# Patient Record
Sex: Female | Born: 1985 | Race: Asian | Hispanic: No | Marital: Married | Smoking: Never smoker
Health system: Southern US, Community
[De-identification: ages and names within clinical notes are randomized; demographics above are authoritative.]

## PROBLEM LIST (undated history)

## (undated) DIAGNOSIS — R5600 Simple febrile convulsions: Secondary | ICD-10-CM

## (undated) DIAGNOSIS — N200 Calculus of kidney: Secondary | ICD-10-CM

## (undated) DIAGNOSIS — H409 Unspecified glaucoma: Secondary | ICD-10-CM

## (undated) HISTORY — PX: EYE SURGERY: SHX253

## (undated) HISTORY — PX: BREAST SURGERY: SHX581

---
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
## 2007-09-09 ENCOUNTER — Ambulatory Visit (HOSPITAL_COMMUNITY): Admission: RE | Admit: 2007-09-09 | Discharge: 2007-09-09 | Payer: Self-pay | Admitting: Obstetrics & Gynecology

## 2007-12-16 ENCOUNTER — Inpatient Hospital Stay (HOSPITAL_COMMUNITY): Admission: AD | Admit: 2007-12-16 | Discharge: 2007-12-16 | Payer: Self-pay | Admitting: Obstetrics

## 2007-12-23 ENCOUNTER — Inpatient Hospital Stay (HOSPITAL_COMMUNITY): Admission: AD | Admit: 2007-12-23 | Discharge: 2007-12-25 | Payer: Self-pay | Admitting: Obstetrics

## 2008-09-11 IMAGING — US US OB COMP +14 WK
1 series · 14 of 28 positions shown · non-contrast
Comparison: none

OBSTETRICAL ULTRASOUND:

 This ultrasound exam was performed in the [HOSPITAL] Ultrasound Department.  The OB US report was generated in the AS system, and faxed to the ordering physician.  This report is also available in [REDACTED] PACS.

[Series 1: us ob comp +14 wk · 83 acquisitions, 14 frames shown]
[im 4/83]
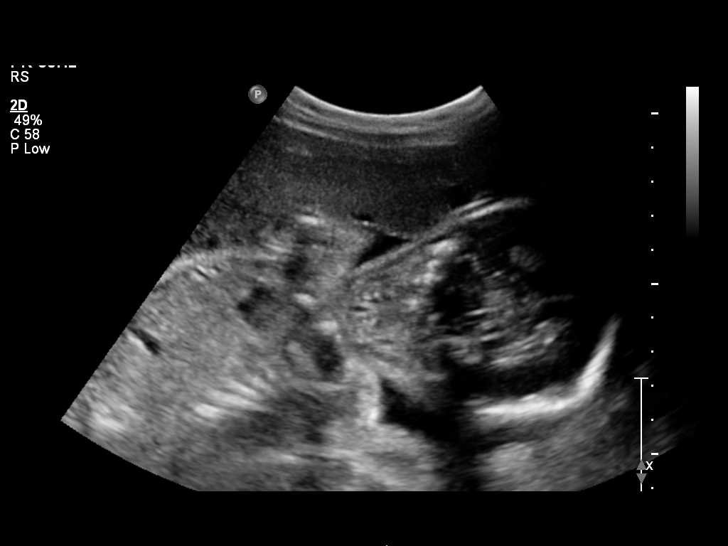
[im 10/83]
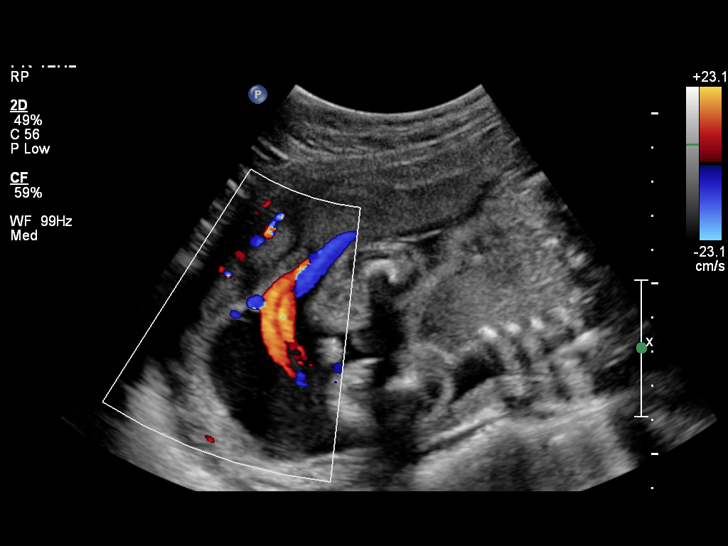
[im 16/83]
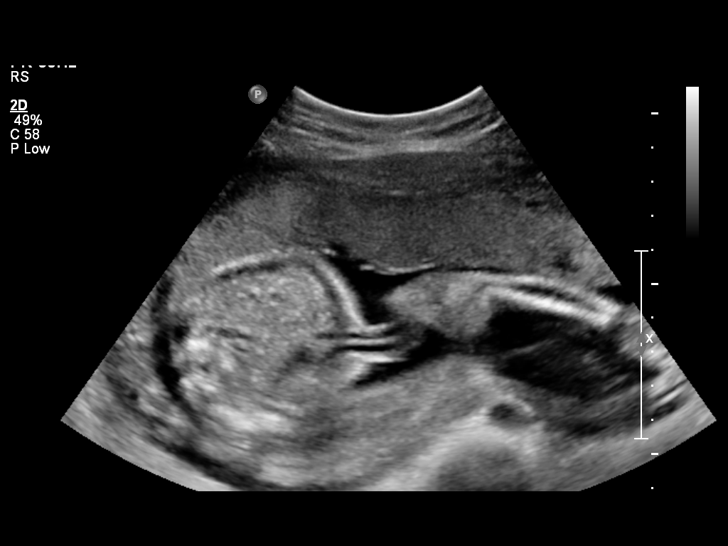
[im 22/83]
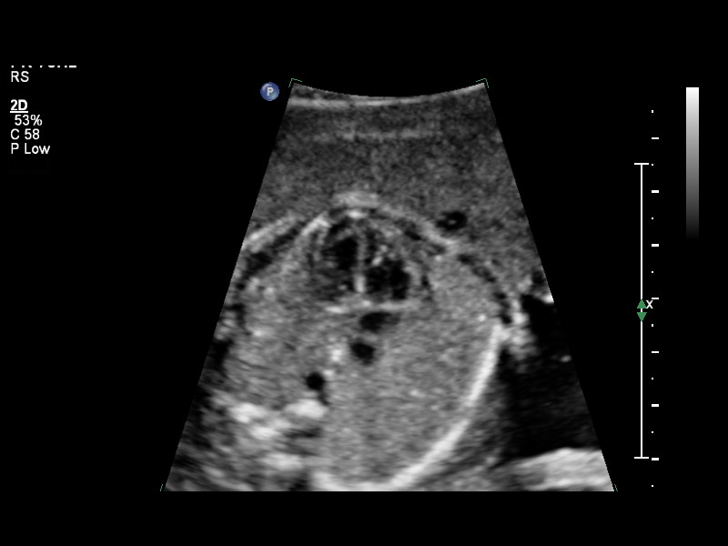
[im 28/83]
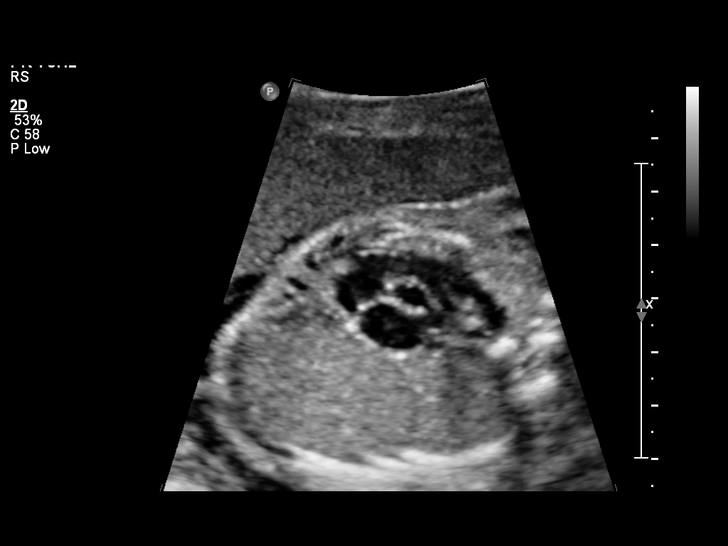
[im 34/83]
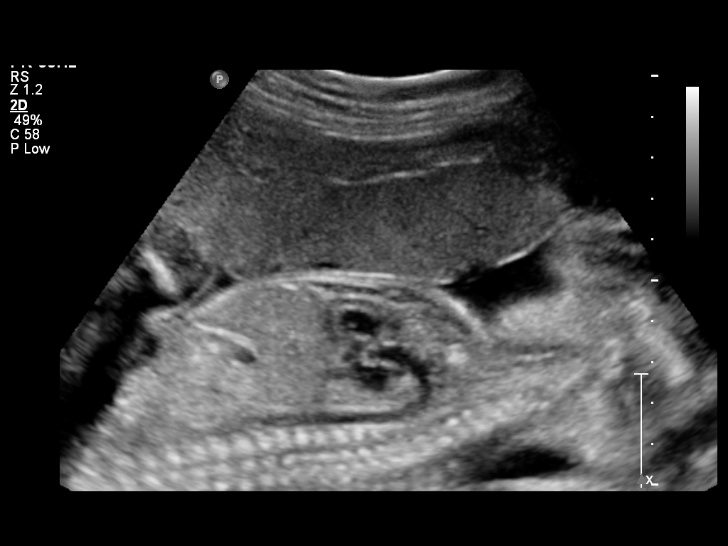
[im 40/83]
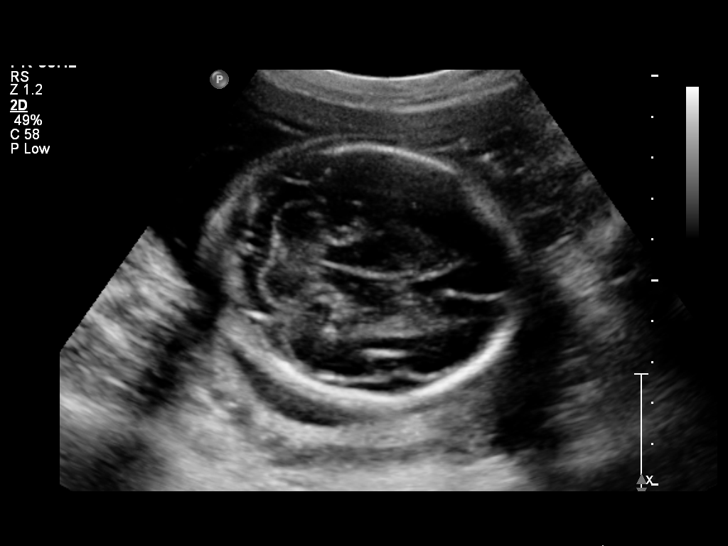
[im 46/83]
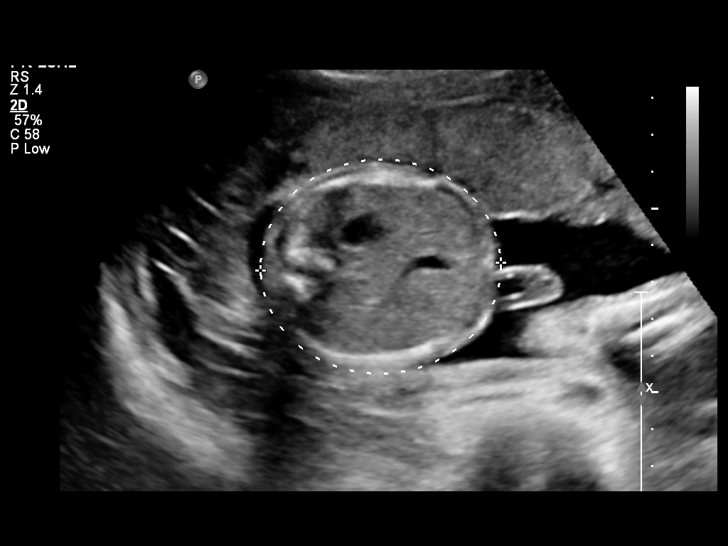
[im 52/83]
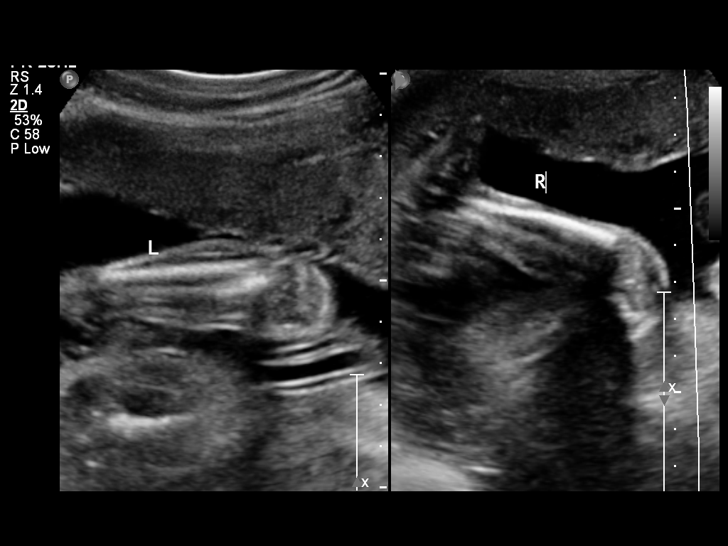
[im 58/83]
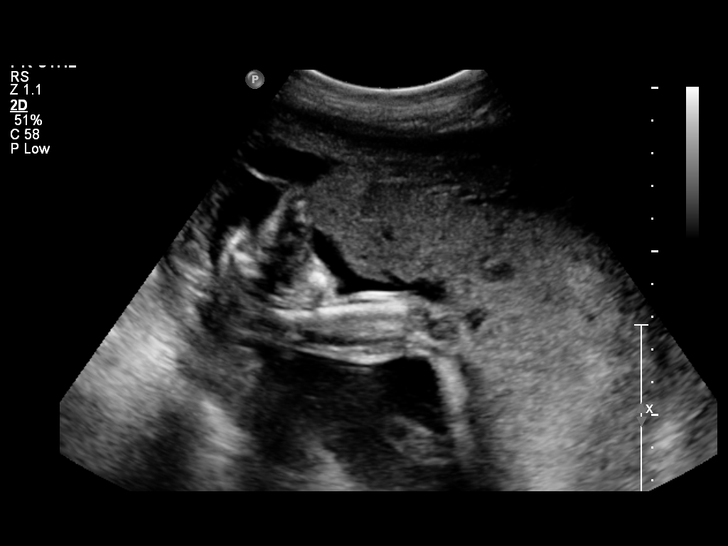
[im 64/83]
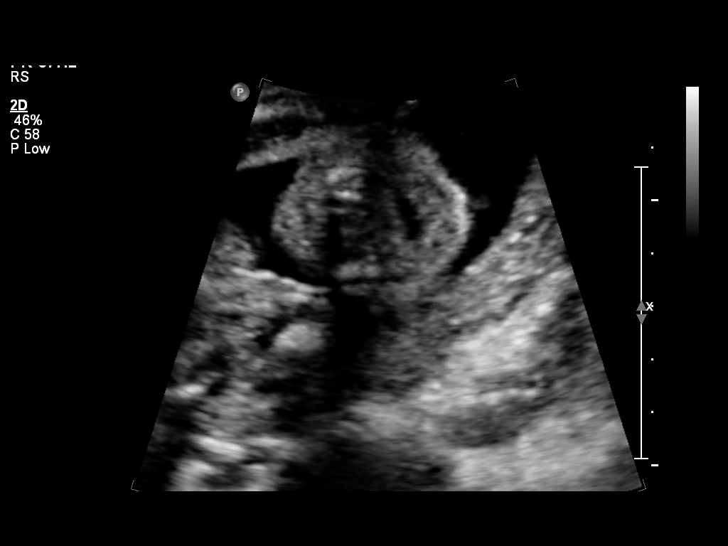
[im 70/83]
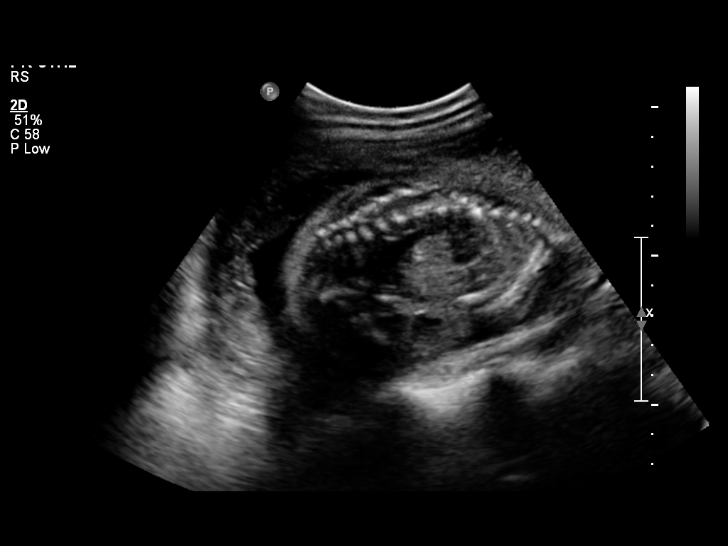
[im 76/83]
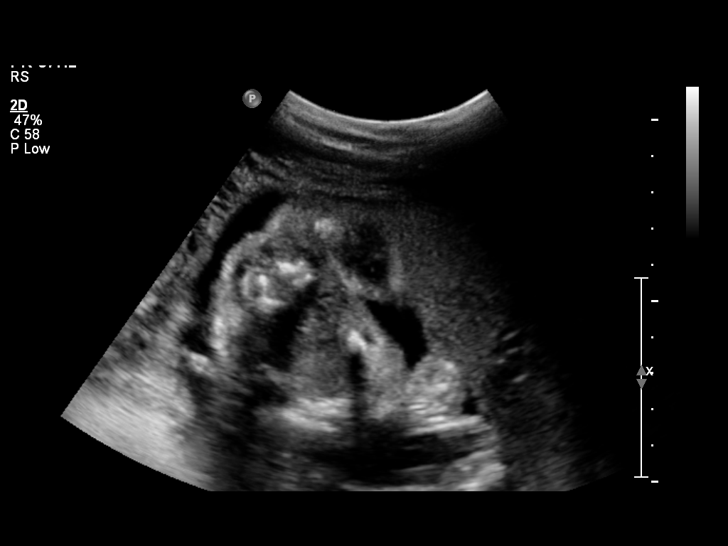
[im 83/83]
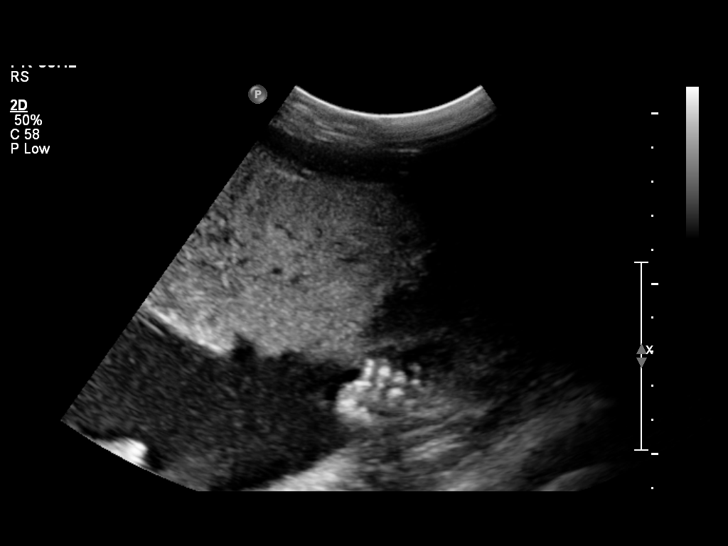

[14 of 28 positions shown; findings below may reference images not displayed]

IMPRESSION: See AS Obstetric US report.

## 2010-04-04 ENCOUNTER — Emergency Department (HOSPITAL_COMMUNITY): Admission: EM | Admit: 2010-04-04 | Discharge: 2010-04-05 | Payer: Self-pay | Admitting: Emergency Medicine

## 2010-11-03 LAB — POCT I-STAT, CHEM 8
BUN: <3 mg/dL — ABNORMAL LOW (ref 6–23)
Chloride: 106 mEq/L (ref 96–112)
Creatinine, Ser: 0.8 mg/dL (ref 0.4–1.2)
Potassium: 3.5 mEq/L (ref 3.5–5.1)
Sodium: 144 mEq/L (ref 135–145)

## 2010-11-03 LAB — ETHANOL: Alcohol, Ethyl (B): 190 mg/dL — ABNORMAL HIGH (ref 0–10)

## 2011-01-05 NOTE — H&P (Signed)
Kaitlyn [name], Kaitlyn [name]         ACCOUNT NO.:  [id number]   MEDICAL RECORD NO.:  [id number]          PATIENT TYPE:  INP   LOCATION:  9156                          FACILITY:  [facility]   PHYSICIAN:  Roseanna Rainbow, M.D.DATE OF BIRTH:  [birth year]   DATE OF ADMISSION:  07/02/2006  DATE OF DISCHARGE:                                HISTORY & PHYSICAL   CHIEF COMPLAINT:  The patient is an 25 year old with an intrauterine  pregnancy at 32 weeks complaining of right flank pain and dysuria.   HISTORY OF PRESENT ILLNESS:  The patient was treated for a urinary tract  infection several weeks prior.  She denies any fever, nausea and vomiting.   ALLERGIES:  NO KNOWN DRUG ALLERGIES.   OBSTETRICAL HISTORY:  She has history of a voluntary termination of  pregnancy early.   GYNECOLOGICAL HISTORY:  Noncontributory.  Pap smear normal July 2007.   PAST MEDICAL HISTORY:  Glaucoma.   PAST SURGICAL HISTORY:  Laser eye surgery.   SOCIAL HISTORY:  Married, living with her spouse, does not give any  significant history of alcohol use, has no significant smoking history.  Denies illicit drug use.   FAMILY HISTORY:  Liver disorder.   PRENATAL LABS:  Chlamydia negative.  Gonorrhea negative.  Hepatitis B  surface antigen negative.  Hemoglobin 12.2, hematocrit 37.1.  HIV  nonreactive.  Quad screen normal.  Platelets 284,000.  Blood type A  positive.  Antibody screen negative.  Rubella immune.  RPR nonreactive.   PHYSICAL EXAMINATION:  GENERAL APPEARANCE:  Uncomfortable.  VITAL SIGNS:  Temperature 98.1, pulse 103, blood pressure 114/78,  respirations 18.  ABDOMEN:  Nontender.  PELVIC:  Cervix is closed as per the RN.  BACK:  There is costovertebral angle tenderness appreciated.   Fetal heart tracing reassuring.  Tocodynamometer: irregular low amplitude,  short duration contractions.   LABORATORY DATA:  Urinalysis with trace hemoglobin.  White blood cell count  12.3, hemoglobin 10.4.   ASSESSMENT:  1. Intrauterine pregnancy at 32 weeks.  2. Rule out early pyelonephritis.  3. Rule out possible urolithiasis.   PLAN:  Admission, IV hydration, parenteral antibiotics, renal ultrasound.      Roseanna Rainbow, M.D.  Electronically Signed     LAJ/MEDQ  D:  07/02/2006  T:  07/02/2006  Job:  [id number]

## 2011-01-05 NOTE — Discharge Summary (Signed)
Kaitlyn [name], JAWOROWSKI         ACCOUNT NO.:  [id number]   MEDICAL RECORD NO.:  [id number]          PATIENT TYPE:  INP   LOCATION:  9156                          FACILITY:  [facility]   PHYSICIAN:  Charles A. Clearance Coots, M.D.DATE OF BIRTH:  24-Sep-1985   DATE OF ADMISSION:  07/02/2006  DATE OF DISCHARGE:  07/04/2006                                 DISCHARGE SUMMARY   ADMITTING DIAGNOSES:  [redacted] weeks gestation, flank pain and dysuria, rule out  pyelonephritis versus urolithiasis.   DISCHARGE DIAGNOSES:  [redacted] weeks gestation, flank pain and dysuria, rule out  pyelonephritis versus urolithiasis.  Probable urolithiasis and the passage  of small stone, now resolved.  Discharged home at [redacted] weeks gestation in good  condition undelivered.   REASON FOR ADMISSION:  An 25 year old female at [redacted] weeks gestation presents  with complaint of right flank pain and dysuria.  The patient was treated for  urinary tract infection several weeks prior to admission.  She denied fever,  nausea and vomiting,   PAST MEDICAL HISTORY:   SURGERY:  Laser eye surgery.   ILLNESSES:  Glaucoma.   MEDICATIONS:  Prenatal vitamins   ALLERGIES:  NO KNOWN DRUG ALLERGIES   SOCIAL HISTORY:  Married, living with spouse.  Negative history of tobacco,  alcohol or recreational drug use.   FAMILY HISTORY:  Positive for liver disorder.   PHYSICAL EXAM:  Slim female in no acute distress.  Temperature 98.1, pulse  103, blood pressure 114/78, respiratory rate 18.  LUNGS:  Were clear to  auscultation bilaterally.  HEART: Regular rate and rhythm.  ABDOMEN:  Gravid, nontender.  BACK:  Positive right costovertebral angle tenderness.  PELVIC:  The cervix was long and closed per examination by a nurse.  External fetal monitor irregular uterine contractions and mild.  Fetal heart  tracing was reassuring.   ADMITTING LABORATORY VALUES:  Hemoglobin 10.4, hematocrit 31.7, white blood  cell count 12,300, platelets 244,000.  Comprehensive  metabolic panel was  within normal limits.  Urinalysis was remarkable for specific gravity 1.010,  trace hemoglobin, 0-2 white blood cells, 0-2 red blood cells, rare  epithelial cell, rare bacteria.  Urine was sent for culture.   HOSPITAL COURSE:  The patient was admitted and started on IV antibiotic  therapy.  Responded well to IV antibiotic therapy and was having no pain  after 24 hours of IV antibiotic therapy.  Urine culture was negative for any  bacterial growth.  The patient continued to have no pain and ultrasound was  performed which revealed mild bilateral pelvocaliectasis but no evidence of  obstruction, probable physiologic change of pregnancy.  The patient  continued to do well in the resolution of her symptoms and continued to be  asymptomatic after 42 hours and was discharged home on hospital day #2 at [redacted]  weeks gestation undelivered, symptoms of flank pain and dysuria resolved, in  good condition.   DISCHARGE LABORATORY VALUES:  Hemoglobin 10.3, hematocrit 31, white blood  cell count 8300, platelets 234,000.   DISCHARGE DISPOSITION:  Continue prenatal vitamins, Ceftin 500 mg p.o. twice  a day for 5 days.  The patient is to call  office for a follow-up appointment  for prenatal care in 1 week.  Precautions were given for urolithiasis.      Charles A. Clearance Coots, M.D.  Electronically Signed     CAH/MEDQ  D:  07/04/2006  T:  07/04/2006  Job:  [id number]

## 2014-02-18 LAB — OB RESULTS CONSOLE RUBELLA ANTIBODY, IGM: RUBELLA: IMMUNE

## 2014-02-18 LAB — OB RESULTS CONSOLE ANTIBODY SCREEN: ANTIBODY SCREEN: NEGATIVE

## 2014-02-18 LAB — OB RESULTS CONSOLE HEPATITIS B SURFACE ANTIGEN: HEP B S AG: NEGATIVE

## 2014-02-18 LAB — OB RESULTS CONSOLE ABO/RH: RH TYPE: POSITIVE

## 2014-02-18 LAB — OB RESULTS CONSOLE GC/CHLAMYDIA
Chlamydia: NEGATIVE
Gonorrhea: NEGATIVE

## 2014-02-18 LAB — OB RESULTS CONSOLE HIV ANTIBODY (ROUTINE TESTING): HIV: NONREACTIVE

## 2014-02-18 LAB — OB RESULTS CONSOLE RPR: RPR: NONREACTIVE

## 2014-08-20 NOTE — L&D Delivery Note (Signed)
Delivery Note At 5:51 AM a viable female was delivered via Vaginal, Spontaneous Delivery (Presentation: Middle Occiput Posterior).  APGAR: 5, 8; weight  .   Placenta status: Intact, Spontaneous.  Cord: 3 vessels with the following complications: None.  Cord pH: 7.08 Loose nuchal cord X 1  Anesthesia: Epidural Local  Episiotomy: None Lacerations:  Sec deg Suture Repair: 3.0 vicryl rapide Est. Blood Loss (mL):  300 Mom to postpartum.  Baby to Couplet care / Skin to Skin.  Kaitlyn [name],Kaitlyn [name] 09/17/2014, 6:12 AM

## 2014-09-03 LAB — OB RESULTS CONSOLE GBS: STREP GROUP B AG: NEGATIVE

## 2014-09-08 ENCOUNTER — Encounter (HOSPITAL_COMMUNITY): Payer: Self-pay

## 2014-09-08 ENCOUNTER — Inpatient Hospital Stay (HOSPITAL_COMMUNITY): Payer: 59

## 2014-09-08 ENCOUNTER — Inpatient Hospital Stay (HOSPITAL_COMMUNITY)
Admission: AD | Admit: 2014-09-08 | Discharge: 2014-09-08 | Disposition: A | Discharge status: Home / Self Care | Payer: 59 | Source: Ambulatory Visit | Attending: Obstetrics and Gynecology | Admitting: Obstetrics and Gynecology

## 2014-09-08 DIAGNOSIS — O471 False labor at or after 37 completed weeks of gestation: Secondary | ICD-10-CM | POA: Insufficient documentation

## 2014-09-08 DIAGNOSIS — O368390 Maternal care for abnormalities of the fetal heart rate or rhythm, unspecified trimester, not applicable or unspecified: Secondary | ICD-10-CM | POA: Insufficient documentation

## 2014-09-08 DIAGNOSIS — Z3A37 37 weeks gestation of pregnancy: Secondary | ICD-10-CM | POA: Diagnosis not present

## 2014-09-08 HISTORY — DX: Simple febrile convulsions: R56.00

## 2014-09-08 HISTORY — DX: Unspecified glaucoma: H40.9

## 2014-09-08 LAB — URINALYSIS, ROUTINE W REFLEX MICROSCOPIC
BILIRUBIN URINE: NEGATIVE
Glucose, UA: NEGATIVE mg/dL
HGB URINE DIPSTICK: NEGATIVE
Ketones, ur: >80 mg/dL — AB
Leukocytes, UA: NEGATIVE
Nitrite: NEGATIVE
PH: 6 (ref 5.0–8.0)
Protein, ur: NEGATIVE mg/dL
SPECIFIC GRAVITY, URINE: 1.025 (ref 1.005–1.030)
Urobilinogen, UA: 0.2 mg/dL (ref 0.0–1.0)

## 2014-09-08 MED ORDER — LACTATED RINGERS IV BOLUS (SEPSIS): 1000.0000 mL | Freq: Once | INTRAVENOUS | Status: DC

## 2014-09-08 NOTE — Discharge Instructions (Signed)

## 2014-09-08 NOTE — MAU Note (Signed)
Last office visit, cervix was closed. Having contractions from lower back to around front of abd.  Started at 3:30 am.  No leaking.  No bleeding. Baby moving well.

## 2014-09-16 ENCOUNTER — Inpatient Hospital Stay (HOSPITAL_COMMUNITY)
Admission: AD | Admit: 2014-09-16 | Discharge: 2014-09-19 | DRG: 775 | Disposition: A | Discharge status: Home / Self Care | Payer: 59 | Source: Ambulatory Visit | Attending: Obstetrics and Gynecology | Admitting: Obstetrics and Gynecology

## 2014-09-16 ENCOUNTER — Encounter (HOSPITAL_COMMUNITY): Payer: Self-pay | Admitting: *Deleted

## 2014-09-16 ENCOUNTER — Inpatient Hospital Stay (HOSPITAL_COMMUNITY): Payer: 59 | Admitting: Anesthesiology

## 2014-09-16 ENCOUNTER — Encounter (HOSPITAL_COMMUNITY): Payer: Self-pay

## 2014-09-16 ENCOUNTER — Inpatient Hospital Stay (HOSPITAL_COMMUNITY)
Admission: AD | Admit: 2014-09-16 | Discharge: 2014-09-16 | Disposition: A | Discharge status: Home / Self Care | Payer: 59 | Source: Ambulatory Visit | Attending: Obstetrics and Gynecology | Admitting: Obstetrics and Gynecology

## 2014-09-16 DIAGNOSIS — Z3A38 38 weeks gestation of pregnancy: Secondary | ICD-10-CM | POA: Diagnosis not present

## 2014-09-16 DIAGNOSIS — O368390 Maternal care for abnormalities of the fetal heart rate or rhythm, unspecified trimester, not applicable or unspecified: Secondary | ICD-10-CM

## 2014-09-16 DIAGNOSIS — O471 False labor at or after 37 completed weeks of gestation: Secondary | ICD-10-CM | POA: Insufficient documentation

## 2014-09-16 DIAGNOSIS — Z3A37 37 weeks gestation of pregnancy: Secondary | ICD-10-CM

## 2014-09-16 DIAGNOSIS — IMO0001 Reserved for inherently not codable concepts without codable children: Secondary | ICD-10-CM

## 2014-09-16 HISTORY — DX: Calculus of kidney: N20.0

## 2014-09-16 LAB — CBC
HCT: 34.9 % — ABNORMAL LOW (ref 36.0–46.0)
HEMOGLOBIN: 11.4 g/dL — AB (ref 12.0–15.0)
MCH: 21.5 pg — ABNORMAL LOW (ref 26.0–34.0)
MCHC: 32.7 g/dL (ref 30.0–36.0)
MCV: 65.8 fL — ABNORMAL LOW (ref 78.0–100.0)
PLATELETS: 211 10*3/uL (ref 150–400)
RBC: 5.3 MIL/uL — ABNORMAL HIGH (ref 3.87–5.11)
RDW: 15.2 % (ref 11.5–15.5)
WBC: 17.8 10*3/uL — ABNORMAL HIGH (ref 4.0–10.5)

## 2014-09-16 LAB — TYPE AND SCREEN
ABO/RH(D): A POS
Antibody Screen: NEGATIVE

## 2014-09-16 MED ORDER — ACETAMINOPHEN 325 MG PO TABS: 650.0000 mg | ORAL_TABLET | ORAL | Status: DC | PRN

## 2014-09-16 MED ORDER — PHENYLEPHRINE 40 MCG/ML (10ML) SYRINGE FOR IV PUSH (FOR BLOOD PRESSURE SUPPORT)
80.0000 ug | PREFILLED_SYRINGE | INTRAVENOUS | Status: DC | PRN
  Filled 2014-09-16: qty 20

## 2014-09-16 MED ORDER — LACTATED RINGERS IV SOLN
INTRAVENOUS | Status: DC
  Administered 2014-09-16 – 2014-09-17 (×2): via INTRAVENOUS

## 2014-09-16 MED ORDER — PROMETHAZINE HCL 25 MG/ML IJ SOLN
12.5000 mg | Freq: Four times a day (QID) | INTRAMUSCULAR | Status: DC | PRN
  Administered 2014-09-16: 12.5 mg via INTRAVENOUS
  Filled 2014-09-16: qty 1

## 2014-09-16 MED ORDER — BUTORPHANOL TARTRATE 1 MG/ML IJ SOLN
1.0000 mg | Freq: Once | INTRAMUSCULAR | Status: AC
  Administered 2014-09-16: 1 mg via INTRAVENOUS
  Filled 2014-09-16: qty 1

## 2014-09-16 MED ORDER — OXYTOCIN BOLUS FROM INFUSION
500.0000 mL | INTRAVENOUS | Status: DC
  Administered 2014-09-17: 500 mL via INTRAVENOUS

## 2014-09-16 MED ORDER — OXYTOCIN 40 UNITS IN LACTATED RINGERS INFUSION - SIMPLE MED
INTRAVENOUS | Status: AC
  Administered 2014-09-17: 1 m[IU]/min via INTRAVENOUS
  Filled 2014-09-16: qty 1000

## 2014-09-16 MED ORDER — OXYCODONE-ACETAMINOPHEN 5-325 MG PO TABS
1.0000 | ORAL_TABLET | Freq: Once | ORAL | Status: AC
  Administered 2014-09-16: 1 via ORAL
  Filled 2014-09-16: qty 1

## 2014-09-16 MED ORDER — EPHEDRINE 5 MG/ML INJ: 10.0000 mg | INTRAVENOUS | Status: DC | PRN

## 2014-09-16 MED ORDER — OXYTOCIN 40 UNITS IN LACTATED RINGERS INFUSION - SIMPLE MED: 62.5000 mL/h | INTRAVENOUS | Status: DC

## 2014-09-16 MED ORDER — OXYCODONE-ACETAMINOPHEN 5-325 MG PO TABS: 2.0000 | ORAL_TABLET | ORAL | Status: DC | PRN

## 2014-09-16 MED ORDER — FLEET ENEMA 7-19 GM/118ML RE ENEM: 1.0000 | ENEMA | RECTAL | Status: DC | PRN

## 2014-09-16 MED ORDER — FENTANYL 2.5 MCG/ML BUPIVACAINE 1/10 % EPIDURAL INFUSION (WH - ANES)
12.0000 mL/h | INTRAMUSCULAR | Status: DC | PRN
  Administered 2014-09-16: 12 mL/h via EPIDURAL
  Filled 2014-09-16 (×2): qty 125

## 2014-09-16 MED ORDER — CITRIC ACID-SODIUM CITRATE 334-500 MG/5ML PO SOLN: 30.0000 mL | ORAL | Status: DC | PRN

## 2014-09-16 MED ORDER — PHENYLEPHRINE 40 MCG/ML (10ML) SYRINGE FOR IV PUSH (FOR BLOOD PRESSURE SUPPORT): 80.0000 ug | PREFILLED_SYRINGE | INTRAVENOUS | Status: DC | PRN

## 2014-09-16 MED ORDER — OXYCODONE-ACETAMINOPHEN 5-325 MG PO TABS
1.0000 | ORAL_TABLET | ORAL | Status: DC | PRN
  Administered 2014-09-17: 1 via ORAL
  Filled 2014-09-16: qty 1

## 2014-09-16 MED ORDER — LIDOCAINE HCL (PF) 1 % IJ SOLN
30.0000 mL | INTRAMUSCULAR | Status: AC | PRN
  Administered 2014-09-17: 30 mL via SUBCUTANEOUS
  Filled 2014-09-16: qty 30

## 2014-09-16 MED ORDER — DIPHENHYDRAMINE HCL 50 MG/ML IJ SOLN: 12.5000 mg | INTRAMUSCULAR | Status: DC | PRN

## 2014-09-16 MED ORDER — LACTATED RINGERS IV SOLN
500.0000 mL | INTRAVENOUS | Status: DC | PRN
  Administered 2014-09-17: 500 mL via INTRAVENOUS

## 2014-09-16 MED ORDER — FENTANYL 2.5 MCG/ML BUPIVACAINE 1/10 % EPIDURAL INFUSION (WH - ANES)
14.0000 mL/h | INTRAMUSCULAR | Status: DC | PRN
  Filled 2014-09-16: qty 125

## 2014-09-16 MED ORDER — LIDOCAINE HCL (PF) 1 % IJ SOLN
INTRAMUSCULAR | Status: DC | PRN
  Administered 2014-09-16: 3 mL
  Administered 2014-09-16: 5 mL

## 2014-09-16 MED ORDER — ONDANSETRON HCL 4 MG/2ML IJ SOLN: 4.0000 mg | Freq: Four times a day (QID) | INTRAMUSCULAR | Status: DC | PRN

## 2014-09-16 MED ORDER — LACTATED RINGERS IV SOLN
500.0000 mL | Freq: Once | INTRAVENOUS | Status: AC
  Administered 2014-09-16: 500 mL via INTRAVENOUS

## 2014-09-16 MED ORDER — LIDOCAINE HCL (PF) 1 % IJ SOLN
INTRAMUSCULAR | Status: AC
  Administered 2014-09-17: 30 mL via SUBCUTANEOUS
  Filled 2014-09-16: qty 30

## 2014-09-16 NOTE — H&P (Signed)
Kaitlyn [name] is a 29 y.o. female presenting for SOL. Maternal Medical History:  Reason for admission: Contractions.   Contractions: Onset was 3-5 hours ago.   Frequency: regular.   Perceived severity is moderate.    Fetal activity: Perceived fetal activity is normal.   Last perceived fetal movement was within the past hour.      OB History    Gravida Para Term Preterm AB TAB SAB Ectopic Multiple Living   3 2  2      2       Obstetric Comments   2 prior births at 7335 weeks     Past Medical History  Diagnosis Date  . Glaucoma   . Febrile seizures    Past Surgical History  Procedure Laterality Date  . Eye surgery    . Breast surgery     Family History: family history is negative for Alcohol abuse, Arthritis, Asthma, Birth defects, Cancer, COPD, Depression, Diabetes, Drug abuse, Early death, Hearing loss, Heart disease, Hyperlipidemia, Hypertension, Kidney disease, Learning disabilities, Mental illness, Mental retardation, Miscarriages / Stillbirths, Stroke, Vision loss, and Varicose Veins. Social History:  reports that she has never smoked. She has never used smokeless tobacco. She reports that she does not drink alcohol or use illicit drugs.   Prenatal Transfer Tool  Maternal Diabetes: No Genetic Screening: Normal Maternal Ultrasounds/Referrals: Normal Fetal Ultrasounds or other Referrals:  None Maternal Substance Abuse:  No Significant Maternal Medications:  None Significant Maternal Lab Results:  None Other Comments:  None  ROS  Dilation: 6 Effacement (%): 100 Station: -1 Exam by:: e. poore, rn Blood pressure 124/83, pulse 101, temperature 97.5 F (36.4 C), temperature source Oral, resp. rate 22, height 5' (1.524 [name]), weight 120 lb 12.8 oz (54.795 kg). Maternal Exam:  Uterine Assessment: Contraction strength is moderate.  Contraction frequency is regular.   Abdomen: Patient reports no abdominal tenderness. Fundal height is term FH.   Estimated fetal  weight is AGA.   Fetal presentation: vertex  Introitus: Normal vulva. Normal vagina.  Pelvis: adequate for delivery.      Physical Exam  Constitutional: She is oriented to person, place, and time. She appears well-developed and well-nourished.  HENT:  Head: Normocephalic and atraumatic.  Neck: Normal range of motion. Neck supple.  Cardiovascular: Normal rate and regular rhythm.   Respiratory: Effort normal and breath sounds normal.  GI:  Term FH, FHR 148  Genitourinary:  4-5/75/vtx  Musculoskeletal: Normal range of motion.  Neurological: She is alert and oriented to person, place, and time.    Prenatal labs: ABO, Rh: A/Positive/-- (07/02 0000) Antibody: Negative (07/02 0000) Rubella: Immune (07/02 0000) RPR: Nonreactive (07/02 0000)  HBsAg: Negative (07/02 0000)  HIV: Non-reactive (07/02 0000)  GBS: Negative (01/15 0000)   Assessment/Plan: Term IUP/labor   Kaitlyn [name],Kaitlyn [name] 09/16/2014, 9:04 PM

## 2014-09-16 NOTE — MAU Note (Signed)
Had a OB appointment today and had membranes stripped;c/o ucs;

## 2014-09-16 NOTE — Plan of Care (Signed)
Problem: Consults Goal: Birthing Suites Patient Information Press F2 to bring up selections list  Outcome: Completed/Met Date Met:  09/16/14  Pt 37-[redacted] weeks EGA

## 2014-09-16 NOTE — Discharge Instructions (Signed)

## 2014-09-16 NOTE — MAU Note (Signed)
PT  WAS  JUST D/C  HOME - HAS RETURNED  IN MORE  PAIN -  BROUGHT  STRAIGHT  FROM LOBBY  TO RM 6.

## 2014-09-16 NOTE — Anesthesia Preprocedure Evaluation (Signed)

## 2014-09-16 NOTE — Anesthesia Procedure Notes (Signed)
Epidural Patient location during procedure: OB  Preanesthetic Checklist Completed: patient identified, site marked, surgical consent, pre-op evaluation, timeout performed, IV checked, risks and benefits discussed and monitors and equipment checked  Epidural Patient position: sitting Prep: site prepped and draped and DuraPrep Patient monitoring: continuous pulse ox and blood pressure Approach: midline Location: L3-L4 Injection technique: LOR air  Needle:  Needle type: Tuohy  Needle gauge: 17 G Needle length: 9 cm and 9 Needle insertion depth: 4 cm Catheter type: closed end flexible Catheter size: 19 Gauge Catheter at skin depth: 10 cm Test dose: negative  Assessment Events: blood not aspirated, injection not painful, no injection resistance, negative IV test and no paresthesia  Additional Notes Dosing of Epidural:  1st dose, through catheter ............................................. Xylocaine 30 mg  2nd dose, through catheter, after waiting 3 minutes........Xylocaine 50 mg   ( 1% Xylo charted as a single dose in Epic Meds for ease of charting; actual dosing was fractionated as above, for saftey's sake)  As each dose occurred, patient was free of IV sx; and patient exhibited no evidence of SA injection.  Patient is more comfortable after epidural dosed. Please see RN's note for documentation of vital signs,and FHR which are stable.  Patient reminded not to try to ambulate with numb legs, and that an RN must be present the 1st time she attempts to get up.    

## 2014-09-17 ENCOUNTER — Encounter (HOSPITAL_COMMUNITY): Payer: Self-pay

## 2014-09-17 LAB — ABO/RH: ABO/RH(D): A POS

## 2014-09-17 MED ORDER — OXYCODONE-ACETAMINOPHEN 5-325 MG PO TABS: 2.0000 | ORAL_TABLET | ORAL | Status: DC | PRN

## 2014-09-17 MED ORDER — TETANUS-DIPHTH-ACELL PERTUSSIS 5-2.5-18.5 LF-MCG/0.5 IM SUSP: 0.5000 mL | Freq: Once | INTRAMUSCULAR | Status: DC

## 2014-09-17 MED ORDER — BENZOCAINE-MENTHOL 20-0.5 % EX AERO
1.0000 "application " | INHALATION_SPRAY | CUTANEOUS | Status: DC | PRN
  Administered 2014-09-17: 1 via TOPICAL
  Filled 2014-09-17 (×2): qty 56

## 2014-09-17 MED ORDER — TERBUTALINE SULFATE 1 MG/ML IJ SOLN: 0.2500 mg | Freq: Once | INTRAMUSCULAR | Status: DC | PRN

## 2014-09-17 MED ORDER — IBUPROFEN 800 MG PO TABS
800.0000 mg | ORAL_TABLET | Freq: Three times a day (TID) | ORAL | Status: DC | PRN
  Administered 2014-09-17 – 2014-09-19 (×6): 800 mg via ORAL
  Filled 2014-09-17 (×7): qty 1

## 2014-09-17 MED ORDER — SIMETHICONE 80 MG PO CHEW: 80.0000 mg | CHEWABLE_TABLET | ORAL | Status: DC | PRN

## 2014-09-17 MED ORDER — SENNOSIDES-DOCUSATE SODIUM 8.6-50 MG PO TABS
2.0000 | ORAL_TABLET | ORAL | Status: DC
  Administered 2014-09-17 – 2014-09-18 (×2): 2 via ORAL
  Filled 2014-09-17 (×2): qty 2

## 2014-09-17 MED ORDER — WITCH HAZEL-GLYCERIN EX PADS: 1.0000 "application " | MEDICATED_PAD | CUTANEOUS | Status: DC | PRN

## 2014-09-17 MED ORDER — PRENATAL MULTIVITAMIN CH
1.0000 | ORAL_TABLET | Freq: Every day | ORAL | Status: DC
  Administered 2014-09-18 – 2014-09-19 (×2): 1 via ORAL
  Filled 2014-09-17 (×2): qty 1

## 2014-09-17 MED ORDER — FLEET ENEMA 7-19 GM/118ML RE ENEM: 1.0000 | ENEMA | Freq: Every day | RECTAL | Status: DC | PRN

## 2014-09-17 MED ORDER — DIBUCAINE 1 % RE OINT
1.0000 "application " | TOPICAL_OINTMENT | RECTAL | Status: DC | PRN
  Filled 2014-09-17: qty 28

## 2014-09-17 MED ORDER — BISACODYL 10 MG RE SUPP
10.0000 mg | Freq: Every day | RECTAL | Status: DC | PRN
  Filled 2014-09-17: qty 1

## 2014-09-17 MED ORDER — LANOLIN HYDROUS EX OINT: TOPICAL_OINTMENT | CUTANEOUS | Status: DC | PRN

## 2014-09-17 MED ORDER — ONDANSETRON HCL 4 MG PO TABS: 4.0000 mg | ORAL_TABLET | ORAL | Status: DC | PRN

## 2014-09-17 MED ORDER — OXYTOCIN 40 UNITS IN LACTATED RINGERS INFUSION - SIMPLE MED
1.0000 m[IU]/min | INTRAVENOUS | Status: DC
  Administered 2014-09-17: 1 m[IU]/min via INTRAVENOUS
  Filled 2014-09-17: qty 1000

## 2014-09-17 MED ORDER — ONDANSETRON HCL 4 MG/2ML IJ SOLN: 4.0000 mg | INTRAMUSCULAR | Status: DC | PRN

## 2014-09-17 MED ORDER — DIPHENHYDRAMINE HCL 25 MG PO CAPS: 25.0000 mg | ORAL_CAPSULE | Freq: Four times a day (QID) | ORAL | Status: DC | PRN

## 2014-09-17 MED ORDER — ZOLPIDEM TARTRATE 5 MG PO TABS: 5.0000 mg | ORAL_TABLET | Freq: Every evening | ORAL | Status: DC | PRN

## 2014-09-17 MED ORDER — MEASLES, MUMPS & RUBELLA VAC ~~LOC~~ INJ
0.5000 mL | INJECTION | Freq: Once | SUBCUTANEOUS | Status: DC
  Filled 2014-09-17: qty 0.5

## 2014-09-17 MED ORDER — OXYCODONE-ACETAMINOPHEN 5-325 MG PO TABS: 1.0000 | ORAL_TABLET | ORAL | Status: DC | PRN

## 2014-09-17 NOTE — Anesthesia Postprocedure Evaluation (Signed)
  Anesthesia Post-op Note  Patient: Kaitlyn [name]  Procedure(s) Performed: * No procedures listed *  Patient Location: Mother/Baby  Anesthesia Type:Epidural  Level of Consciousness: awake, alert  and oriented  Airway and Oxygen Therapy: Patient Spontanous Breathing  Post-op Pain: none  Post-op Assessment: Post-op Vital signs reviewed and Patient's Cardiovascular Status Stable  Post-op Vital Signs: Reviewed and stable  Last Vitals:  Filed Vitals:   09/17/14 0840  BP: 98/61  Pulse: 58  Temp: 36.9 C  Resp: 16    Complications: No apparent anesthesia complications

## 2014-09-17 NOTE — Plan of Care (Signed)
Problem: Phase II Progression Outcomes Goal: Initiate breastfeeding within 1hr delivery Outcome: Not Applicable Date Met:  04/59/13 Patient has chosen not to breastfeed

## 2014-09-17 NOTE — Progress Notes (Signed)
Patient is doing well. Breastfeeding VSS  Routine postpartum care

## 2014-09-17 NOTE — Lactation Note (Signed)
This note was copied from the chart of Kaitlyn [name]. Lactation Consultation Note  Initial visit made.  Mother's choice to formula feed on admission.  Asked mom if she has interest in pumping for her baby in NICU.  Mom has questions about pumping with breast implants.  Reassured mom that pumping is still possible without problems with implants or milk.  Mom states she attempted to breastfeed and pump with a manual pump with first two babies but had a low milk supply.  Explained to mom that using a double electric pump is best to establish and maintain milk supply.  Mom will consider pumping and she will ask nurse to set up pump if she changes her mind.  Patient Name: Kaitlyn Audie BoxHCandy Eshelman ZOXWR'UToday's Date: 09/17/2014 Reason for consult: Initial assessment;NICU baby   Maternal Data    Feeding Feeding Type: Formula Nipple Type: Slow - flow Length of feed: 10 min  LATCH Score/Interventions                      Lactation Tools Discussed/Used     Consult Status      Huston FoleyMOULDEN, Geordie Nooney S 09/17/2014, 4:16 PM

## 2014-09-17 NOTE — Progress Notes (Signed)
comft with epid>>AROM clr AF, now 8-9/zero, ISE on, variabl decels w/ recovery

## 2014-09-18 LAB — CBC
HCT: 29.3 % — ABNORMAL LOW (ref 36.0–46.0)
Hemoglobin: 9.6 g/dL — ABNORMAL LOW (ref 12.0–15.0)
MCH: 22.2 pg — ABNORMAL LOW (ref 26.0–34.0)
MCHC: 32.8 g/dL (ref 30.0–36.0)
MCV: 67.8 fL — ABNORMAL LOW (ref 78.0–100.0)
Platelets: 171 K/uL (ref 150–400)
RBC: 4.32 MIL/uL (ref 3.87–5.11)
RDW: 14.5 % (ref 11.5–15.5)
WBC: 16.6 K/uL — ABNORMAL HIGH (ref 4.0–10.5)

## 2014-09-18 LAB — RPR: RPR Ser Ql: NONREACTIVE

## 2014-09-18 NOTE — Progress Notes (Signed)
S:  No complaints. Resting.  :  BP 97/62 mmHg  Pulse 64  Temp(Src) 98 F (36.7 C) (Oral)  Resp 16  Ht 5' (1.524 m)  Wt 54.795 kg (120 lb 12.8 oz)  BMI 23.59 kg/m2  SpO2 99%  Breastfeeding? Unknown Abdomen is soft and non tender No excessive vaginal bleeding  Results for orders placed or performed during the hospital encounter of 09/16/14 (from the past 24 hour(s))  CBC     Status: Abnormal   Collection Time: 09/18/14  5:53 AM  Result Value Ref Range   WBC 16.6 (H) 4.0 - 10.5 K/uL   RBC 4.32 3.87 - 5.11 MIL/uL   Hemoglobin 9.6 (L) 12.0 - 15.0 g/dL   HCT 09.829.3 (L) 11.936.0 - 14.746.0 %   MCV 67.8 (L) 78.0 - 100.0 fL   MCH 22.2 (L) 26.0 - 34.0 pg   MCHC 32.8 30.0 - 36.0 g/dL   RDW 82.914.5 56.211.5 - 13.015.5 %   Platelets 171 150 - 400 K/uL    Impression: Post partum Day #1 Doing well Routing care Discharge home tomorrow

## 2014-09-19 MED ORDER — OXYCODONE-ACETAMINOPHEN 5-325 MG PO TABS: 1.0000 | ORAL_TABLET | ORAL | Status: DC | PRN

## 2014-09-19 MED ORDER — IBUPROFEN 800 MG PO TABS: 800.0000 mg | ORAL_TABLET | Freq: Three times a day (TID) | ORAL | Status: DC | PRN

## 2014-09-19 NOTE — Discharge Summary (Signed)
Obstetric Discharge Summary Reason for Admission: onset of labor Prenatal Procedures: none Intrapartum Procedures: spontaneous vaginal delivery Postpartum Procedures: none Complications-Operative and Postpartum: none HEMOGLOBIN  Date Value Ref Range Status  09/18/2014 9.6* 12.0 - 15.0 g/dL Final   HCT  Date Value Ref Range Status  09/18/2014 29.3* 36.0 - 46.0 % Final    Physical Exam:  General: alert, cooperative and appears stated age 54Lochia: appropriate Uterine Fundus: firm Incision: healing well DVT Evaluation: No evidence of DVT seen on physical exam.  Discharge Diagnoses: Term Pregnancy-delivered  Discharge Information: Date: 09/19/2014 Activity: pelvic rest Diet: routine Medications: Ibuprofen and Percocet Condition: improved Instructions: refer to practice specific booklet Discharge to: home   Newborn Data: Live born female  Birth Weight: 5 lb 0.6 oz (2285 g) APGAR: 5, 8  Home with mother.  Glenette Bookwalter L 09/19/2014, 7:48 AM

## 2016-04-12 ENCOUNTER — Ambulatory Visit (INDEPENDENT_AMBULATORY_CARE_PROVIDER_SITE_OTHER): Payer: 59

## 2016-04-12 ENCOUNTER — Encounter (HOSPITAL_COMMUNITY): Payer: Self-pay | Admitting: Emergency Medicine

## 2016-04-12 ENCOUNTER — Ambulatory Visit (HOSPITAL_COMMUNITY)
Admission: EM | Admit: 2016-04-12 | Discharge: 2016-04-12 | Disposition: A | Discharge status: Home / Self Care | Payer: 59 | Attending: Emergency Medicine | Admitting: Emergency Medicine

## 2016-04-12 DIAGNOSIS — S9031XA Contusion of right foot, initial encounter: Secondary | ICD-10-CM

## 2016-04-12 MED ORDER — NAPROXEN 500 MG PO TABS: 500.0000 mg | ORAL_TABLET | Freq: Two times a day (BID) | ORAL | 0 refills | Status: AC | PRN

## 2016-04-12 NOTE — Discharge Instructions (Signed)
Start Naproxen twice a day as needed for pain and swelling. Keep foot elevated and use ace wrap for comfort and support. May continue to apply ice for next 24 hours. Recommend follow-up with a foot doctor if pain/bruising does not improve within the next 3 to 4 days.

## 2016-04-12 NOTE — ED Provider Notes (Signed)
CSN: [id number]     Arrival date & time 04/12/16  1858 History   First MD Initiated Contact with Patient 04/12/16 2018     Chief Complaint  Patient presents with  . Foot Injury   (Consider location/radiation/quality/duration/timing/severity/associated sxs/prior Treatment) [age] year old female presents with acute right foot injury. She was at the gym working out when she dropped a 25lb weight on her right foot. Now swollen and very tender along with bruising on her foot near base of great toe and 2nd toe. Has applied ice but has not taken anything for pain and swelling. No previous injury to her foot. No chronic health issues and takes no daily medication.    The history is provided by the patient and the spouse.  Foot Injury  Associated symptoms: no fever     Past Medical History:  Diagnosis Date  . Febrile seizures (HCC)   . Glaucoma   . Kidney stones    with previous pregnancy   Past Surgical History:  Procedure Laterality Date  . BREAST SURGERY     breast implants  . EYE SURGERY     Family History  Problem Relation Age of Onset  . Alcohol abuse Neg Hx   . Arthritis Neg Hx   . Asthma Neg Hx   . Birth defects Neg Hx   . Cancer Neg Hx   . COPD Neg Hx   . Depression Neg Hx   . Diabetes Neg Hx   . Drug abuse Neg Hx   . Early death Neg Hx   . Hearing loss Neg Hx   . Heart disease Neg Hx   . Hyperlipidemia Neg Hx   . Hypertension Neg Hx   . Kidney disease Neg Hx   . Learning disabilities Neg Hx   . Mental illness Neg Hx   . Mental retardation Neg Hx   . Miscarriages / Stillbirths Neg Hx   . Stroke Neg Hx   . Vision loss Neg Hx   . Varicose Veins Neg Hx    Social History  Substance Use Topics  . Smoking status: Never Smoker  . Smokeless tobacco: Never Used  . Alcohol use No   OB History    Gravida Para Term Preterm AB Living   3 3 1 2   1    SAB TAB Ectopic Multiple Live Births         0 1      Obstetric Comments   2 prior births at 35 weeks     Review of  Systems  Constitutional: Negative for fever.  Musculoskeletal: Positive for joint swelling.  Skin: Positive for color change.  Neurological: Negative for numbness.    Allergies  Review of patient's allergies indicates no known allergies.  Home Medications   Prior to Admission medications   Medication Sig Start Date End Date Taking? Authorizing Provider  naproxen (NAPROSYN) 500 MG tablet Take 1 tablet (500 mg total) by mouth 2 (two) times daily as needed for moderate pain. 04/12/16   Sudie GrumblingAnn Berry Alayzha An, NP   Meds Ordered and Administered this Visit  Medications - No data to display  BP 114/74 (BP Location: Left Arm)   Pulse 68   Temp 98.5 F (36.9 C) (Oral)   Resp 16   LMP 04/09/2016 (Exact Date)   SpO2 100%  No data found.   Physical Exam  Constitutional: She is oriented to person, place, and time. She appears well-developed and well-nourished. No distress.  Cardiovascular: Normal rate and  regular rhythm.   Musculoskeletal: She exhibits edema and tenderness.       Right foot: There is decreased range of motion, tenderness and swelling. There is normal capillary refill, no deformity and no laceration.       Feet:  Swollen, ecchymosis and very tender over 1st and 2nd metatarsal about 5cm by 3cm. Can move toes but with pain. Good capillary refill. Slight decrease in color of toes due to positioning (foot has been dangling from exam table for over 30 minutes). Toes on left foot appear similar. Has full range of motion of ankle. No numbness present.   Neurological: She is alert and oriented to person, place, and time. No sensory deficit.  Skin: Skin is warm and dry. Capillary refill takes less than 2 seconds.  Psychiatric: She has a normal mood and affect. Her behavior is normal. Judgment and thought content normal.    Urgent Care Course   Clinical Course    Procedures (including critical care time)  Labs Review Labs Reviewed - No data to display  Imaging Review Dg Foot  Complete Right  Result Date: 04/12/2016 CLINICAL DATA:  Was at the gym, a 25 lb weight slipped out of gloved hand, struck foot mid first and second metatarsal, busing near distal medial aspect of foot. Pain is localized to effected area, toes unaffected. EXAM: RIGHT FOOT COMPLETE - 3+ VIEW COMPARISON:  None. FINDINGS: No fracture.  No bone lesion. Joints normally spaced and aligned. There is mild dorsal soft tissue swelling. IMPRESSION: No fracture or dislocation. Electronically Signed   By: Amie Portlandavid  Ormond M.D.   On: 04/12/2016 19:54     Visual Acuity Review  Right Eye Distance:   Left Eye Distance:   Bilateral Distance:    Right Eye Near:   Left Eye Near:    Bilateral Near:         MDM   1. Foot contusion, right, initial encounter    Reviewed x-ray results with patient and husband. No fracture seen. Recommend continue to elevate foot. Wrap foot with ace wrap for comfort and support. Continue to apply ice for next 24 hours. Take Naproxen 500mg  every 12 hours as needed for pain and swelling. Go to ER if pain increases, numbness or decreased temperature occurs in foot. Otherwise, follow-up here for referral to Orthopedic if pain and swelling does not improve within the next 3 to 4 days.     Sudie GrumblingAnn Berry Uchenna Rappaport, NP 04/13/16 [phone]

## 2016-04-12 NOTE — ED Triage Notes (Signed)
The [facility] with a complaint of right foot pain secondary to dropping a 25 pound weight on her foot today.

## 2017-04-15 IMAGING — DX DG FOOT COMPLETE 3+V*R*
3 series · 3 of 3 positions shown · non-contrast
Comparison: None.

CLINICAL DATA: Was at the gym, a 25 lb weight slipped out of gloved
hand, struck foot mid first and second metatarsal, busing near
distal medial aspect of foot. Pain is localized to effected area,
toes unaffected.

EXAM:
RIGHT FOOT COMPLETE - 3+ VIEW

[foot ap]
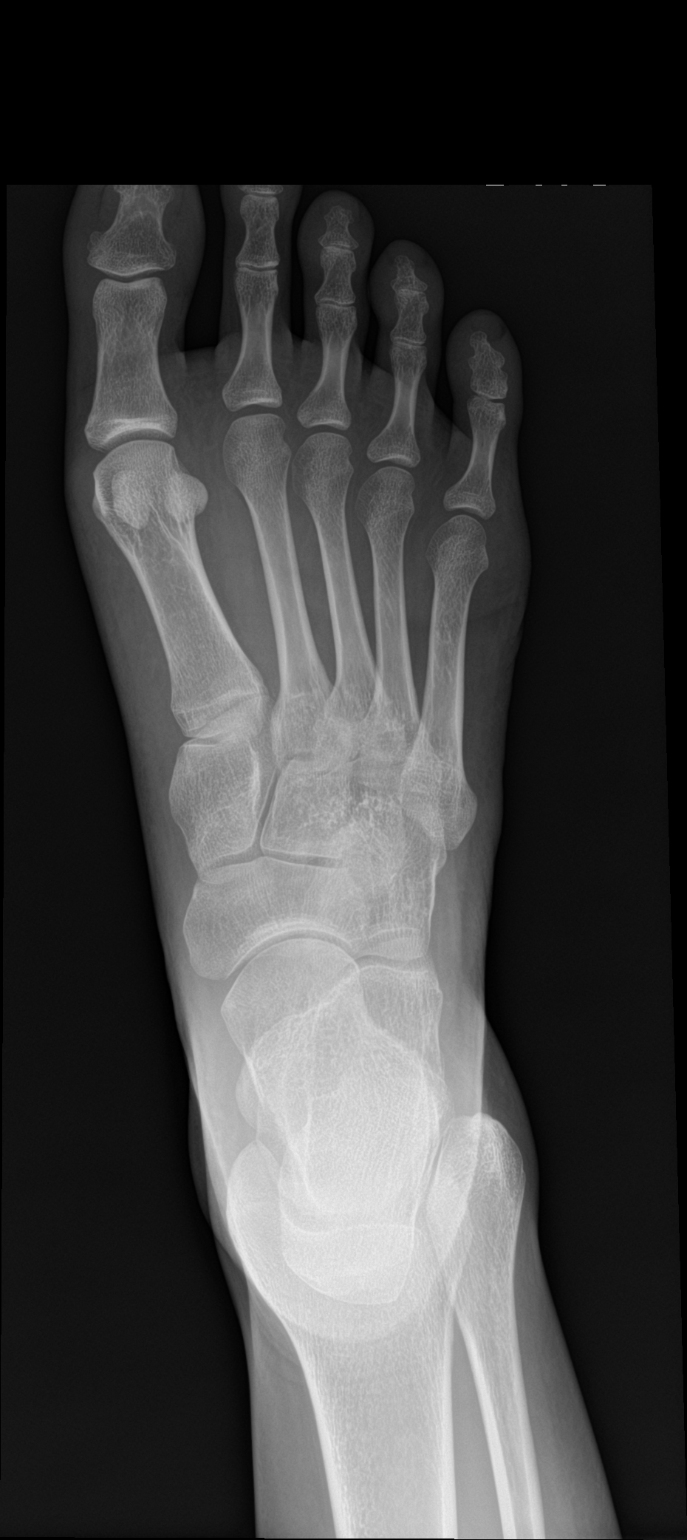

[foot obl]
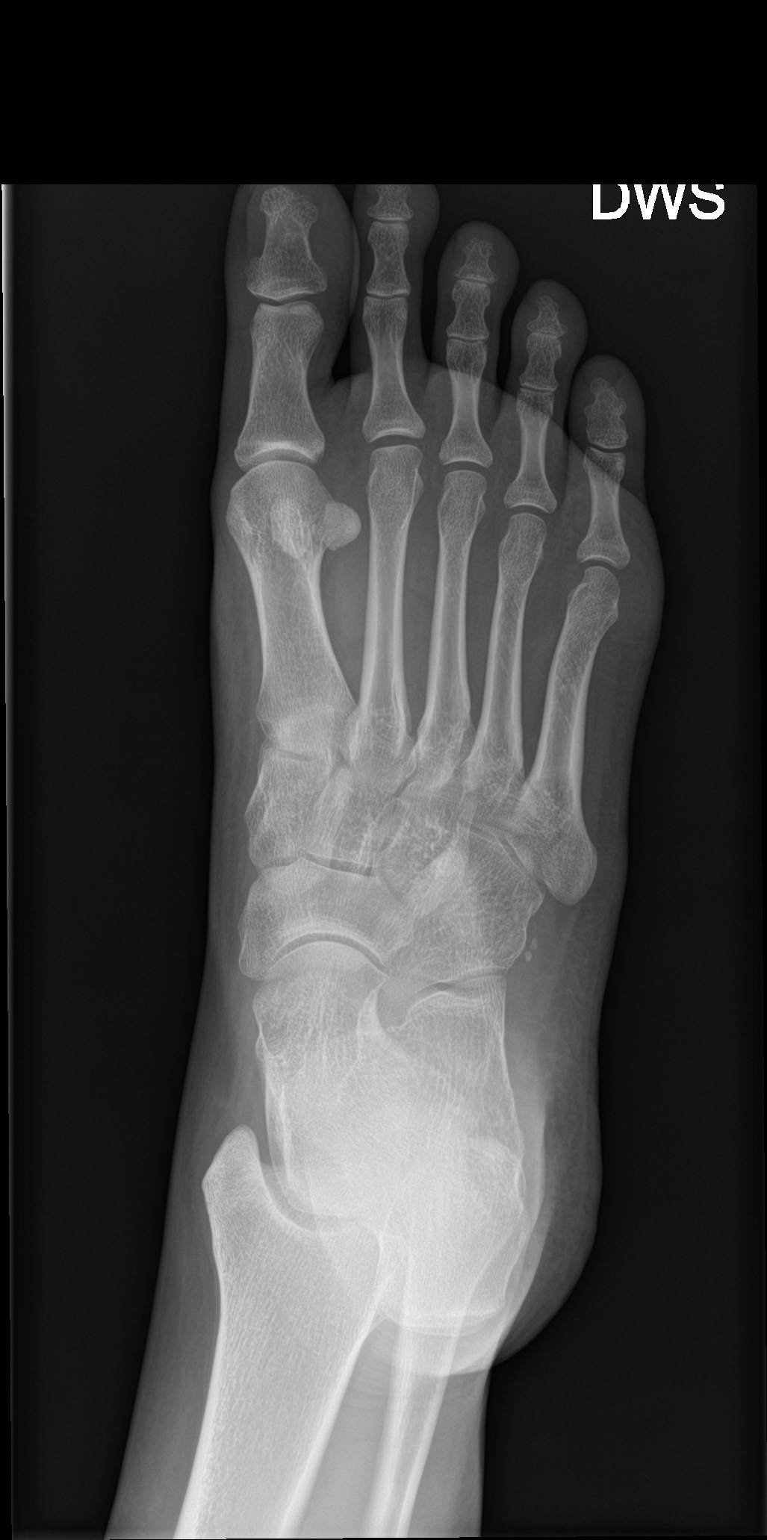

[foot lat]
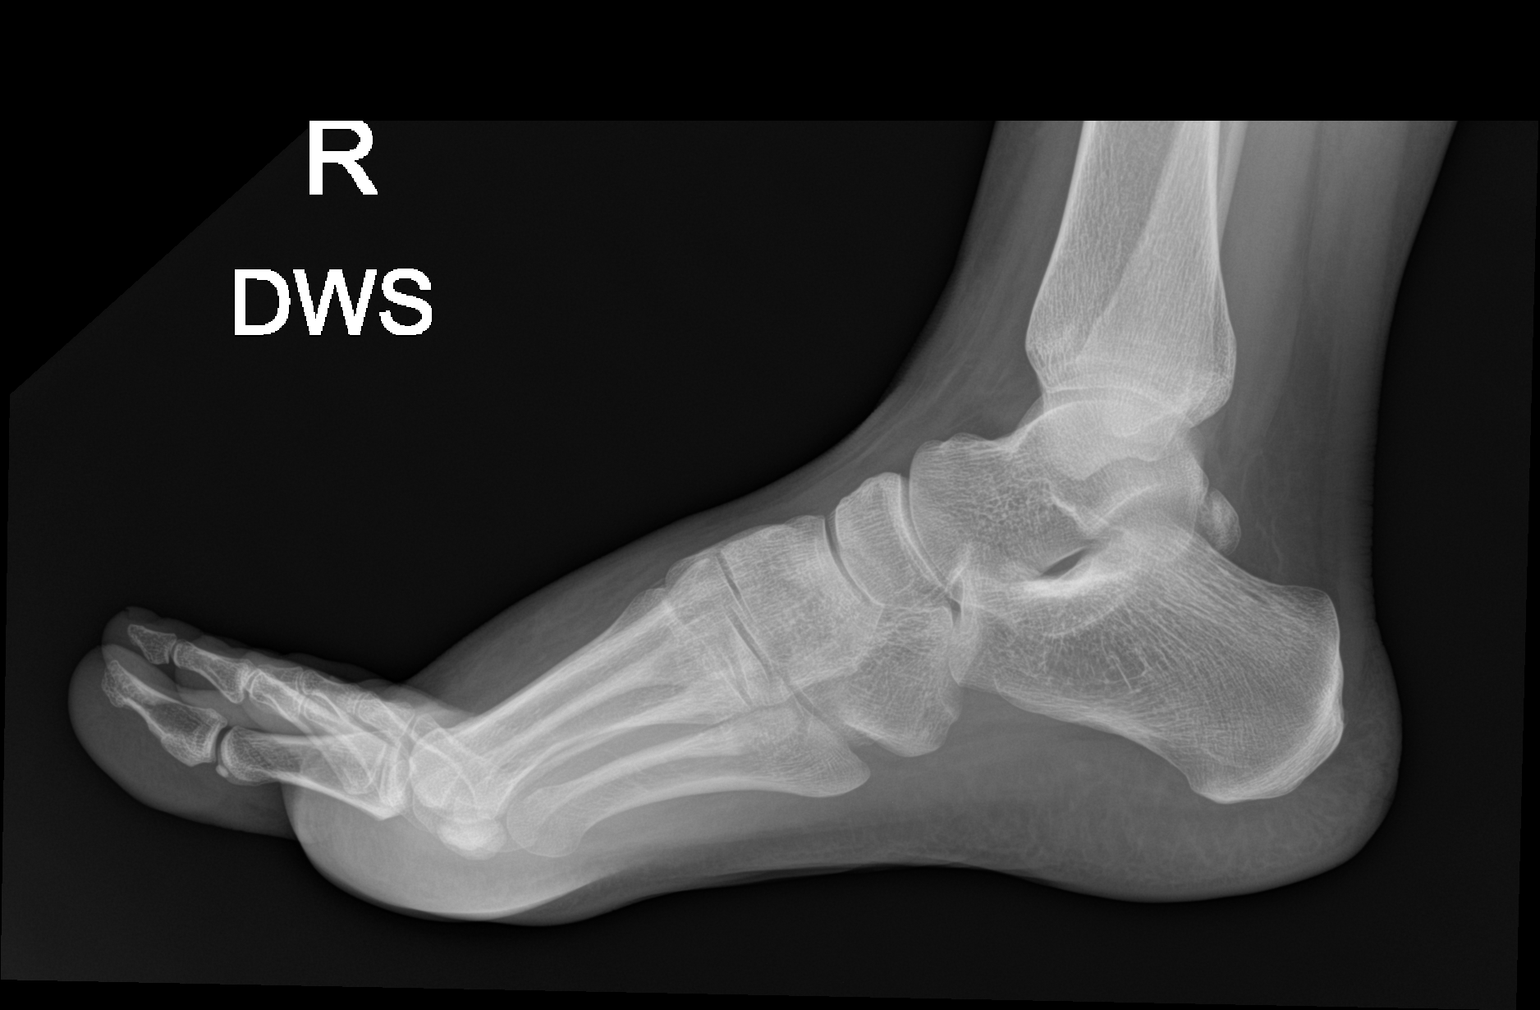

[3 of 3 positions shown; findings below may reference images not displayed]

FINDINGS: No fracture.  No bone lesion.

Joints normally spaced and aligned.

There is mild dorsal soft tissue swelling.
IMPRESSION: No fracture or dislocation.

## 2023-06-07 ENCOUNTER — Ambulatory Visit (INDEPENDENT_AMBULATORY_CARE_PROVIDER_SITE_OTHER): Payer: 59 | Admitting: Ophthalmology

## 2023-06-07 ENCOUNTER — Encounter (INDEPENDENT_AMBULATORY_CARE_PROVIDER_SITE_OTHER): Payer: Self-pay | Admitting: Ophthalmology

## 2023-06-07 DIAGNOSIS — H3322 Serous retinal detachment, left eye: Secondary | ICD-10-CM | POA: Diagnosis not present

## 2023-06-07 DIAGNOSIS — H21303 Idiopathic cysts of iris, ciliary body or anterior chamber, bilateral: Secondary | ICD-10-CM

## 2023-06-07 DIAGNOSIS — H2703 Aphakia, bilateral: Secondary | ICD-10-CM

## 2023-06-07 NOTE — Progress Notes (Signed)
[facility] - Clinic Note  06/07/2023   CHIEF COMPLAINT Patient presents for Retina Evaluation  HISTORY OF PRESENT ILLNESS: Kaitlyn [name] is a 37 y.o. female who presents to the clinic today for:  HPI     Retina Evaluation   In left eye.  This started 1 day ago.  Duration of 1 day.  Associated Symptoms Flashes and Floaters.  Context:  distance vision.  I, the attending physician,  performed the HPI with the patient and updated documentation appropriately.        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    Pt is here on the referral of Dr. 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 Wynelle Link, pt normally goes to [facility] on [address], but that office if completely remote and she wanted to see an actual dr, so she went to [facility] [place], pt does not have a follow up with Dr. Wynelle Link, pt had cataract sx when she was 5 and was left aphakic, but doesn't know much about the surgery or who did it, she does not have any other health problems and no other birth hx, she wears CL and glasses to correct her vision   Referring physician: [facility], [place] [address] [place],  [place] [address]  HISTORICAL INFORMATION:  Selected notes from the MEDICAL RECORD NUMBER Referred by Dr. Harriette Bouillon for concern of RD LEE:  Ocular Hx- PMH-   CURRENT MEDICATIONS: No current outpatient medications on file. (Ophthalmic Drugs)   No current  facility-administered medications for this visit. (Ophthalmic Drugs)   Current Outpatient Medications (Other)  Medication Sig   naproxen (NAPROSYN) 500 MG tablet Take 1 tablet (500 mg total) by mouth 2 (two) times daily as needed for moderate pain.   No current facility-administered medications for this visit. (Other)   REVIEW OF SYSTEMS: ROS   Positive for: Eyes Last edited by Etheleen Mayhew, COT on 06/07/2023  2:09 PM.     ALLERGIES No Known Allergies PAST MEDICAL HISTORY Past Medical History:  Diagnosis Date   Febrile seizures (HCC)    Glaucoma    Kidney stones    with previous pregnancy   Past Surgical History:  Procedure Laterality Date   BREAST SURGERY     breast implants   EYE SURGERY     FAMILY HISTORY Family History  Problem Relation Age of Onset   Alcohol abuse Neg Hx    Arthritis Neg Hx    Asthma Neg Hx    Birth defects Neg Hx    Cancer Neg Hx    COPD Neg Hx    Depression Neg Hx    Diabetes Neg Hx    Drug abuse Neg Hx    Early death Neg Hx    Hearing loss Neg Hx    Heart disease  Neg Hx    Hyperlipidemia Neg Hx    Hypertension Neg Hx    Kidney disease Neg Hx    Learning disabilities Neg Hx    Mental illness Neg Hx    Mental retardation Neg Hx    Miscarriages / Stillbirths Neg Hx    Stroke Neg Hx    Vision loss Neg Hx    Varicose Veins Neg Hx    SOCIAL HISTORY Social History   Tobacco Use   Smoking status: Never   Smokeless tobacco: Never  Substance Use Topics   Alcohol use: No   Drug use: No       OPHTHALMIC EXAM:  Base Eye Exam     Visual Acuity (Snellen - Linear)       Right Left   Dist cc 20/30 -2 20/400   Dist ph cc 20/30 +1 NI    Correction: Contacts         Tonometry (Tonopen, 2:18 PM)       Right Left   Pressure 28 12  Brim cosopt OD         Pupils       Dark Light Shape React APD   Right 3 2 Round Brisk None   Left 4 4 Round NR None         Visual Fields       Left Right     Full    Restrictions Total superior temporal, inferior temporal, superior nasal, inferior nasal deficiencies          Extraocular Movement       Right Left    Full, Ortho Full, Ortho         Neuro/Psych     Oriented x3: Yes   Mood/Affect: Normal         Dilation     Both eyes: 2.5% Phenylephrine @ 2:18 PM           Slit Lamp and Fundus Exam     External Exam       Right Left   External Normal Normal         Slit Lamp Exam       Right Left   Lids/Lashes Normal Normal   Conjunctiva/Sclera White and quiet White and quiet   Cornea Peripheral endo pigment Fine peripheral endo pigment   Anterior Chamber Deep and clear Deep and quiet   Iris irregular pupil, poor dilation to 4.28mm irregular pupil, moderate dilation, iris cyst from 0130-0400   Lens Aphakia Aphakia   Anterior Vitreous clear mild syneresis, +pigment         Fundus Exam       Right Left   Disc mild Pallor, Sharp rim mild Pallor, Sharp rim, tilted inferiorly   C/D Ratio 0.6 0.4   Macula Flat, Good foveal reflex, No heme or edema +SRF, +corrugations; fovea detached   Vessels mild attenuation, mild tortuosity attenuated, mild tortuosity   Periphery Attached, No heme Bullous inferior and temporal detachment from 0100-0730 oclock; early PVR and corrugations inferiorly; no obvious break visualized           Refraction     Manifest Refraction   Unable to correct OS any better           IMAGING AND PROCEDURES  Imaging and Procedures for 06/07/2023  OCT, Retina - OU - Both Eyes       Right Eye Quality was good. Central Foveal Thickness: 295. Progression has no prior data. Findings include normal foveal  contour, no IRF, no SRF, myopic contour (Trace vit opacities).   Left Eye Quality was good. Central Foveal Thickness: 336. Progression has no prior data. Findings include abnormal foveal contour (Bullous temporal detachment w/ +IRF within detached retina).   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 CME - cystoid macular edema. PED - pigment epithelial detachment. IRF - intraretinal fluid. SRF - subretinal fluid. EZ - ellipsoid zone. ERM - epiretinal membrane. ORA - outer retinal atrophy. ORT - outer retinal tubulation. SRHM - subretinal hyper-reflective material. IRHM - intraretinal hyper-reflective material           ASSESSMENT/PLAN:   ICD-10-CM   1. 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   - will refer to [facility] for further evaluation and management -- scheduled with Dr. Imogene Burn on Tuesday, 10.22.24  2. 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 as a pediatric pt  - corrects with glasses and contacts  3. Iris cysts OU   Ophthalmic Meds Ordered this visit:  No orders of the defined types were placed in this encounter.    Return if symptoms worsen or fail to improve.  There are no Patient Instructions on file for this visit.  Explained the diagnoses, plan, and follow up with the patient and they expressed understanding.  Patient expressed understanding of the importance of proper follow up care.   This document serves as a record of services personally performed by Karie Chimera, MD, PhD. It was created on their behalf by Glee Arvin. Manson Passey, OA an ophthalmic technician. The creation of this record is the provider's dictation and/or activities during the visit.    Electronically signed by: Glee Arvin. Manson Passey, OA 06/07/23 4:22 PM  This document serves as a record of services personally performed by Karie Chimera, MD, PhD. It was created on their behalf by Charlette Caffey, COT an ophthalmic technician. The creation of this record is the provider's dictation and/or activities during the visit.    Electronically signed by:  Charlette Caffey, COT  06/07/23 4:22 PM  Karie Chimera, M.D., Ph.D. Diseases & Surgery of the Retina and Vitreous [facility] 06/07/2023  Abbreviations: M myopia (nearsighted); A astigmatism; H hyperopia (farsighted); P presbyopia; Mrx spectacle prescription;  CTL contact lenses; OD right eye; OS left eye; OU both eyes  XT exotropia; ET esotropia; PEK punctate epithelial keratitis; PEE punctate epithelial erosions; DES dry eye syndrome; MGD meibomian gland dysfunction; ATs artificial tears; PFAT's preservative free artificial tears; NSC nuclear sclerotic cataract; PSC posterior subcapsular cataract; ERM epi-retinal membrane; PVD posterior vitreous detachment; RD retinal detachment; DM diabetes mellitus; DR diabetic retinopathy;  NPDR non-proliferative diabetic retinopathy; PDR proliferative diabetic retinopathy; CSME clinically significant macular edema; DME diabetic macular edema; dbh dot blot hemorrhages; CWS cotton wool spot; POAG primary open angle glaucoma; C/D cup-to-disc ratio; HVF humphrey visual field; GVF goldmann visual field; OCT optical coherence tomography; IOP intraocular pressure; BRVO Branch retinal vein occlusion; CRVO central retinal vein occlusion; CRAO central retinal artery occlusion; BRAO branch retinal artery occlusion; RT retinal tear; SB scleral buckle; PPV pars plana vitrectomy; VH Vitreous  hemorrhage; PRP panretinal laser photocoagulation; IVK intravitreal kenalog; VMT vitreomacular traction; MH Macular hole;  NVD neovascularization of the disc; NVE neovascularization elsewhere; AREDS age related eye disease study; ARMD age related macular degeneration; POAG primary open angle glaucoma; EBMD epithelial/anterior basement membrane dystrophy; ACIOL anterior chamber intraocular lens; IOL intraocular lens; PCIOL posterior chamber intraocular lens; Phaco/IOL phacoemulsification with intraocular lens placement; PRK photorefractive keratectomy; LASIK laser assisted in situ keratomileusis; HTN hypertension; DM diabetes mellitus; COPD chronic obstructive pulmonary disease
